# Patient Record
Sex: Female | Born: 1980 | Race: Black or African American | Hispanic: No | Marital: Married | Smoking: Never smoker
Health system: Southern US, Community
[De-identification: ages and names within clinical notes are randomized; demographics above are authoritative.]

---
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
## 2001-02-05 ENCOUNTER — Other Ambulatory Visit: Admission: RE | Admit: 2001-02-05 | Discharge: 2001-02-05 | Payer: Self-pay | Admitting: Gynecology

## 2001-04-23 ENCOUNTER — Emergency Department (HOSPITAL_COMMUNITY): Admission: EM | Admit: 2001-04-23 | Discharge: 2001-04-24 | Payer: Self-pay | Admitting: Internal Medicine

## 2001-04-23 ENCOUNTER — Encounter: Payer: Self-pay | Admitting: Internal Medicine

## 2004-10-03 ENCOUNTER — Other Ambulatory Visit: Admission: RE | Admit: 2004-10-03 | Discharge: 2004-10-03 | Payer: Self-pay | Admitting: Obstetrics & Gynecology

## 2004-10-03 ENCOUNTER — Inpatient Hospital Stay (HOSPITAL_COMMUNITY): Admission: AD | Admit: 2004-10-03 | Discharge: 2004-10-13 | Payer: Self-pay | Admitting: Obstetrics & Gynecology

## 2004-10-07 ENCOUNTER — Encounter: Payer: Self-pay | Admitting: Obstetrics & Gynecology

## 2005-01-26 ENCOUNTER — Other Ambulatory Visit: Admission: RE | Admit: 2005-01-26 | Discharge: 2005-01-26 | Payer: Self-pay | Admitting: Obstetrics & Gynecology

## 2006-08-31 ENCOUNTER — Inpatient Hospital Stay (HOSPITAL_COMMUNITY): Admission: AD | Admit: 2006-08-31 | Discharge: 2006-09-02 | Payer: Self-pay | Admitting: Obstetrics & Gynecology

## 2006-09-03 ENCOUNTER — Encounter: Admission: RE | Admit: 2006-09-03 | Discharge: 2006-10-03 | Payer: Self-pay | Admitting: Obstetrics & Gynecology

## 2006-12-11 ENCOUNTER — Emergency Department (HOSPITAL_COMMUNITY): Admission: EM | Admit: 2006-12-11 | Discharge: 2006-12-11 | Payer: Self-pay | Admitting: Emergency Medicine

## 2009-04-13 ENCOUNTER — Ambulatory Visit (HOSPITAL_COMMUNITY): Admission: RE | Admit: 2009-04-13 | Discharge: 2009-04-13 | Payer: Self-pay | Admitting: Obstetrics & Gynecology

## 2009-05-17 ENCOUNTER — Ambulatory Visit (HOSPITAL_COMMUNITY): Admission: RE | Admit: 2009-05-17 | Discharge: 2009-05-17 | Payer: Self-pay | Admitting: Obstetrics & Gynecology

## 2009-10-24 ENCOUNTER — Inpatient Hospital Stay (HOSPITAL_COMMUNITY): Admission: RE | Admit: 2009-10-24 | Discharge: 2009-10-26 | Payer: Self-pay | Admitting: Orthopedic Surgery

## 2009-10-24 ENCOUNTER — Encounter (INDEPENDENT_AMBULATORY_CARE_PROVIDER_SITE_OTHER): Payer: Self-pay | Admitting: Obstetrics & Gynecology

## 2009-10-27 ENCOUNTER — Encounter: Admission: RE | Admit: 2009-10-27 | Discharge: 2009-11-26 | Payer: Self-pay | Admitting: Obstetrics & Gynecology

## 2009-11-27 ENCOUNTER — Encounter: Admission: RE | Admit: 2009-11-27 | Discharge: 2009-12-12 | Payer: Self-pay | Admitting: Obstetrics & Gynecology

## 2009-12-20 ENCOUNTER — Emergency Department (HOSPITAL_COMMUNITY): Admission: EM | Admit: 2009-12-20 | Discharge: 2009-12-20 | Payer: Self-pay | Admitting: Emergency Medicine

## 2010-04-26 ENCOUNTER — Emergency Department (HOSPITAL_COMMUNITY): Admission: EM | Admit: 2010-04-26 | Discharge: 2010-04-26 | Payer: Self-pay | Admitting: Family Medicine

## 2010-07-19 ENCOUNTER — Encounter: Admission: RE | Admit: 2010-07-19 | Discharge: 2010-07-19 | Payer: Self-pay | Admitting: Obstetrics & Gynecology

## 2010-12-16 LAB — POCT URINALYSIS DIP (DEVICE)
Glucose, UA: NEGATIVE mg/dL
Hgb urine dipstick: NEGATIVE
Nitrite: NEGATIVE
Urobilinogen, UA: 0.2 mg/dL (ref 0.0–1.0)
pH: 6 (ref 5.0–8.0)

## 2010-12-16 LAB — WET PREP, GENITAL: Clue Cells Wet Prep HPF POC: NONE SEEN

## 2010-12-17 LAB — CBC
HCT: 30.3 % — ABNORMAL LOW (ref 36.0–46.0)
Hemoglobin: 10.9 g/dL — ABNORMAL LOW (ref 12.0–15.0)
MCV: 80.9 fL (ref 78.0–100.0)
RBC: 3.74 MIL/uL — ABNORMAL LOW (ref 3.87–5.11)
RBC: 4.37 MIL/uL (ref 3.87–5.11)
WBC: 11.4 10*3/uL — ABNORMAL HIGH (ref 4.0–10.5)
WBC: 7.4 10*3/uL (ref 4.0–10.5)

## 2011-02-16 NOTE — H&P (Signed)
NAMEFREDDI, FORSTER NO.:  [id number]   MEDICAL RECORD NO.:  [id number]          PATIENT TYPE:  INP   LOCATION:  9303                          FACILITY:  [facility]   PHYSICIAN:  Ilda Mori, M.D.   DATE OF BIRTH:  02-Oct-1980   DATE OF ADMISSION:  10/03/2004  DATE OF DISCHARGE:                                HISTORY & PHYSICAL   REASON FOR ADMISSION:  This is a 30 year old nulligravid female, last  menstrual period September 17, 2004 who was admitted with possible tubo-  ovarian abscess.   HISTORY OF PRESENT ILLNESS:  The patient was treated by Urgent Care  approximately two weeks ago with what appears to be urinary tract infection  with Cipro.  She developed more pain and fever and urinary pressure and she  was reevaluated and felt to have possible PID and Levaquin was started.  She  developed fevers to 101 and presented to my office for gynecological  evaluation.   On evaluation in the office, her abdomen was benign but her pelvis was very  tender with fullness and an ultrasound in my office revealed what appeared  to be a 6 cm possible pelvic abscess.   ALLERGIES:  SULFA.   MEDICATIONS:  She was on no medications at the present time other than the  Levaquin.   PAST SURGICAL HISTORY:  No previous surgery.   PAST MEDICAL HISTORY:  No previous history of pelvic inflammatory disease.   FAMILY HISTORY:  Noncontributory.   REVIEW OF SYMPTOMS:  Negative.   PHYSICAL EXAMINATION:  VITAL SIGNS:  Temperature was 101, pulse 90, blood  pressure 118/60.  HEENT:  Normal.  NECK:  Supple.  CHEST:  Clear.  ABDOMEN:  Soft.  Only mildly tender in the lower quadrants.  No rebound and  no guarding.  PELVIC:  Vagina and vulva were normal.  Cervix was clean.  There was some  tenderness on motion.  Her uterus was tender and midline and there was a  right-sided fullness that was very tender.   ADMISSION DIAGNOSIS:  Possible pelvic abscess or tubo-ovarian abscess.   PLAN:   The plan is to admit her for IV antibiotics.   LABORATORY DATA:  Her admission laboratories revealed a white count on  admission of 19.6 thousand.  Electrolytes were essentially normal.  Her  potassium was slightly low at 3.____.      RK/MEDQ  D:  10/06/2004  T:  10/06/2004  Job:  [id number]

## 2011-02-16 NOTE — Op Note (Signed)
Sheri Bailey, Sheri Bailey                ACCOUNT NO.:  [id number]   MEDICAL RECORD NO.:  [id number]          PATIENT TYPE:  INP   LOCATION:  9162                          [facility]ACILITY:  [facility]   PHYSICIAN:  Gerrit Friends. Aldona Bar, M.D.   DATE OF BIRTH:  12-29-1980   DATE OF PROCEDURE:  08/31/2006  DATE OF DISCHARGE:                               OPERATIVE REPORT   PREOPERATIVE DIAGNOSIS:  Term pregnancy, ruptured membranes for 17  hours, arrest disorder of dilatation.   POSTOPERATIVE DIAGNOSES:  Term pregnancy, ruptured membranes for 17  hours, arrest disorder of dilatation, delivery of 7 pounds 8 ounces  female infant, Apgars 09/09.   PROCEDURE:  Primary low transverse cesarean section.   SURGEON:  Gerrit Friends. Aldona Bar, M.D.   ANESTHESIA:  Epidural.   HISTORY:  This 30 year old primigravida was admitted at term in early  morning of 08/31/2006 with ruptured membranes spontaneously at 12:30  a.m. on 12/01 - fluid was clear.  Her prenatal course was uneventful.  Epidural was eventually placed when it was noted that the patient was  progressing.  Examination at 9:00 a.m. found the patient to be 2 cm  dilated, 70% effaced with vertex at -2 station.  An IUPC was placed to  assist facilitate Pitocin augmentation.  The patient by 11:00 a.m. was 4  cm dilated, but unfortunately, but even with adequate labor as  documented by IUPC over the next 5-1/2 hours did not change her cervix.  Decision was made to proceed with low transverse cesarean section  because of arrest disorder of dilatation.   OPERATIVE PROCEDURE:  The patient's epidural was augmented and she was  taken to the operating room with a Foley catheter in place.  Once she  was adequately prepped and draped, good anesthetic levels were  documented and cesarean section was begun in the usual fashion.  A  Pfannenstiel incision was made with minimal difficulty, dissected down  sharply to and through the fascia in low transverse fashion with  hemostasis  created at each layer.  Subfascial space was created  inferiorly and superiorly, muscles separated in the midline.  Peritoneum  identified and entered appropriately with care taken to avoid the bowel  superiorly and the bladder inferiorly.  At this time the vesicouterine  peritoneum was identified, incised in a low transverse fashion, pushed  off the lower uterine segment with ease and sharp incision into the  uterus with Metzenbaum scissors was carried out a low transverse fashion  and extended laterally with fingers.  Thereafter with minimal  difficulty, delivery of viable 7 pounds 8 ounces female infant which  cried spontaneously at once was delivered.  After cord was clamped and  cut, the infant was passed off to the waiting team and ultimately taken  to nursery in good condition.  Apgars were 9 and 9 and subsequent weight  was 7 pounds 8 ounces.   After cord bloods were collected, the placenta was delivered intact.  Uterus was then delivered to facilitate closure.  Uterus was manually  inspected and rendered free of any remaining products of conception.  Good uterine  contractility was afforded with slowly given intravenous  Pitocin and manual stimulation.  Tubes and ovaries appeared normal.  There were some filmy adhesions coming off the back of the uterus  nonetheless, probably related to the patient's previous pelvic abscess  which was treated with drainage and antibiotics in January of 2006.   The ovaries otherwise appeared very normal as did both tubes and  ovaries.   The uterine incision was then closed using a single layer of #1 Vicryl  running locking fashion.  This was oversewn with several figure-of-eight  #1 Vicryls for additional hemostasis.  The abdomen at this time was  lavaged and rendered free of any blood and clot.  Uterus was replaced  into the abdominal incision.  Once all counts noted to be correct and no  foreign bodies noted to be remaining in the abdominal  cavity, closure of  the abdomen was begun in layers.  The abdominal peritoneum was closed  with 0 Vicryl in a running fashion and muscle secured with same.  Subfascial area was rendered hemostatic and the fascia was then  reapproximated with 0 Vicryl from angle to midline bilaterally.  Subcutaneous tissues were hemostatic and staples were used to close  skin.  Sterile pressure dressing was applied and the patient at this  time was transported to recovery room in satisfactory having tolerated  procedure well.  Estimated blood loss 500 mL.  All counts correct x2.  At conclusion of procedure both mother and baby were doing well in  respective recovery areas.      Gerrit Friends. Aldona Bar, M.D.  Electronically Signed     RMW/MEDQ  D:  08/31/2006  T:  09/02/2006  Job:  [id number]

## 2011-02-16 NOTE — Discharge Summary (Signed)
Sheri Bailey, Sheri Bailey                ACCOUNT NO.:  [id number]   MEDICAL RECORD NO.:  [id number]          PATIENT TYPE:  INP   LOCATION:  9148                          [facility]   PHYSICIAN:  Ilda Mori, M.D.   DATE OF BIRTH:  [birth year]   DATE OF ADMISSION:  08/31/2006  DATE OF DISCHARGE:  09/02/2006                               DISCHARGE SUMMARY   FINAL DIAGNOSES:  1. Intrauterine pregnancy at term.  2. Prolonged rupture of membranes.  3. Arrest disorder of dilation.   PROCEDURE:  Primary low transverse cesarean section.   SURGEON:  Dr. Annamaria Helling.   COMPLICATIONS:  None.   HISTORY:  This 30 year old G1, P0 was admitted at term with  spontaneously rupture of membranes at 12:30 a.m. on August 31, 2006.  Fluid was clear. The patient's antepartum course up to this point had  been uncomplicated. She did have some anemia for which she was on iron  and her prenatal vitamins throughout her pregnancy. She had a negative  group B strep culture obtained in the office at around 36 weeks.   HOSPITAL COURSE:  The patient had an epidural placed after she was about  2 cm dilated. Then she had a catheter placed as well. By 11 a.m. patient  was 4 cm dilated, but over the next 5-1/2 hours did not change her  cervix. At this point the decision was made to proceed with a cesarean  section secondary to the arrest disorder of dilation and prolonged  rupture of membranes. The patient was taken to the operating room by Dr.  Annamaria Helling on August 31, 2006 where a primary low transverse cesarean  section was performed with delivery of a 7 pound, 8 ounce female infant  with Apgar's of 9 and 9. The delivery went without complication.  The  patient's operative course was benign without any significant fevers.  She did have a postoperative hemoglobin of 8.8 for which she needed to  be on iron.  She was felt ready for discharge on postoperative day  number two.   DISPOSITION:  She was sent  home on a regular diet. Activity to be  decreased. Told to continue her prenatal vitamins and iron supplement  daily. She could use Colace p.r.n. She was given Tylox 1-2 q.4h p.r.n.  for pain and told she could use Motrin up to 600 mg q.6 hours p.r.n. for  pain. She is to follow up in our office in 4 weeks.   LABS ON DISCHARGE:  Patient had a hemoglobin of 8.8 which is down from  11.1 preoperatively, white blood cell count is 17,500, platelets of  189,000.      [name], P.A.-C.      Ilda Mori, M.D.  Electronically Signed    MB/MEDQ  D:  09/28/2006  T:  09/28/2006  Job:  [id number]

## 2011-02-16 NOTE — Discharge Summary (Signed)
Sheri Bailey, Sheri Bailey                ACCOUNT NO.:  [id number]   MEDICAL RECORD NO.:  [id number]          PATIENT TYPE:  INP   LOCATION:  9303                          [facility]   PHYSICIAN:  Ilda Mori, M.D.   DATE OF BIRTH:  1980-10-08   DATE OF ADMISSION:  10/03/2004  DATE OF DISCHARGE:  10/13/2004                                 DISCHARGE SUMMARY   FINAL DIAGNOSIS:  Tubo-ovarian abscess.   SECONDARY DIAGNOSES:  None.   PROCEDURES:  CT directed percutaneous drainage of pelvic abscess.   COMPLICATIONS:  None.   CONDITION ON DISCHARGE:  Improved.   HISTORY OF PRESENT ILLNESS:  This is a 30 year old nulligravida female who  presented to the office with fever and pelvic pain.  Evaluation in the  office revealed a tender pelvic mass.  Office ultrasound confirmed the  presence of a 6 cm pelvic abscess.  The patient was admitted to the hospital  where IV antibiotics were begun.   HOSPITAL COURSE:  She was treated with clindamycin and gentamicin for 48  hours with minimal improvement.  She remained febrile and developed a  significant persistent leukocytosis.  Ampicillin was begun after 48 hours  and the addition of this antibiotic did not help improve the patient's  condition.  On hospital day #3, she had been on 60 hours of clindamycin and  gentamicin and 12 hours of ampicillin.  She still maintained a white count  of 20,000 and still had spiking temperatures up to 102 degrees.  The  decision was made to proceed with laparotomy, however, after further  consultation with radiology and general surgery, the decision was made to  attempt to do a percutaneous drainage of the abscess.  On the morning of the  fourth hospital day, she was transferred to [facility] where a CT  directed percutaneous catheter was placed into the abscess.  There were 20-  30 mL of pus drained by the radiologist, Sherrine Maples T. Fredia Sorrow, M.D.  The  catheter was left in situ and the patient was  transferred back to [facility] and her antibiotics were continued.  The patient slowly defervesced  over the next three hospital days.  On hospital day #8, her temperature was  101 degrees, which was significantly better than her previous temperature  and her white blood cell count was down to 9800.  The antibiotics were  continued for another 48 hours until the patient had been afebrile for 24  hours.  The catheter was pulled on hospital day #9 when only small amounts  of drainage were noted to be present.  Antibiotics were continued for  another 24 hours and the patient was then discharged.  She was discharged on  a regular diet.  She was told to limit her activity.  She was not given any  further antibiotics to take at that time.  She was asked to return to the  office in four days for followup evaluation.   LABORATORY DATA:  Admission hemoglobin 10.1 and admission white blood cell  count 19,600.  Her hemoglobin dropped to the 7.5  range during the  hospitalization.  Her white count peaked at 22,000 on hospital day #4 and  then began to decrease.  On the day of discharge, her white count was 8900.  CT scan revealed multilocular pelvic collections consistent with  multilocular abscesses, possibly a complications of tubo-ovarian pathology.  The  original CT scan did not appear to show a single dominant component that  would be amenable to drainage.  However, a followup evaluation revealed that  drainage would be possible and hopefully beneficial.  Pelvic ultrasound  revealed a 7 cm complex cystic lesion anterior to the uterus.      RK/MEDQ  D:  10/31/2004  T:  10/31/2004  Job:  [id number]   cc:   Sherrine Maples T. Fredia Sorrow, M.D.  [address]  [place]  [place] [address]  Fax: [phone]

## 2013-05-15 ENCOUNTER — Other Ambulatory Visit: Payer: Self-pay

## 2015-11-22 ENCOUNTER — Emergency Department (HOSPITAL_COMMUNITY)
Admission: EM | Admit: 2015-11-22 | Discharge: 2015-11-23 | Disposition: A | Discharge status: Home / Self Care | Payer: BLUE CROSS/BLUE SHIELD | Attending: Emergency Medicine | Admitting: Emergency Medicine

## 2015-11-22 ENCOUNTER — Encounter (HOSPITAL_COMMUNITY): Payer: Self-pay | Admitting: Emergency Medicine

## 2015-11-22 ENCOUNTER — Emergency Department (HOSPITAL_COMMUNITY): Payer: BLUE CROSS/BLUE SHIELD

## 2015-11-22 DIAGNOSIS — Y9389 Activity, other specified: Secondary | ICD-10-CM | POA: Insufficient documentation

## 2015-11-22 DIAGNOSIS — X58XXXA Exposure to other specified factors, initial encounter: Secondary | ICD-10-CM | POA: Insufficient documentation

## 2015-11-22 DIAGNOSIS — Y998 Other external cause status: Secondary | ICD-10-CM | POA: Diagnosis not present

## 2015-11-22 DIAGNOSIS — Y9289 Other specified places as the place of occurrence of the external cause: Secondary | ICD-10-CM | POA: Insufficient documentation

## 2015-11-22 DIAGNOSIS — S8991XA Unspecified injury of right lower leg, initial encounter: Secondary | ICD-10-CM | POA: Diagnosis present

## 2015-11-22 DIAGNOSIS — M2391 Unspecified internal derangement of right knee: Secondary | ICD-10-CM | POA: Diagnosis not present

## 2015-11-22 NOTE — ED Notes (Signed)
Pt c/o R knee pain onset today while exercising.

## 2015-11-23 MED ORDER — IBUPROFEN 100 MG/5ML PO SUSP
600.0000 mg | Freq: Once | ORAL | Status: AC
  Administered 2015-11-23: 600 mg via ORAL
  Filled 2015-11-23: qty 30

## 2015-11-23 NOTE — ED Provider Notes (Signed)
CSN: [id number]     Arrival date & time 11/22/15  2003 History  By signing my name below, I, Sheri Bailey, attest that this documentation has been prepared under the direction and in the presence of Devoria Albe, MD at [phone]. Electronically Signed: Soijett Bailey, ED Scribe. 11/23/2015. 3:54 AM.   Chief Complaint  Patient presents with  . Knee Injury      The history is provided by the patient. No language interpreter was used.    Sheri Bailey is a 35 y.o. female who presents to the Emergency Department complaining of right knee injury occurring 1 PM yesterday. Pt states that she heard a pop while doing jumping jacks with no pain initially. She states about 4 PM she started getting some discomfort in the medial part of her knee and by 6 PM he had gotten so bad she was unable to walk on her knee. Pt is having associated symptoms of right knee swelling. She notes that she has not tried any medications for the relief of her symptoms. She denies color change, wound, rash, and any other symptoms. Pt denies having an orthopedist at this time.   PCP none   History reviewed. No pertinent past medical history. History reviewed. No pertinent past surgical history. No family history on file. Social History  Substance Use Topics  . Smoking status: Never Smoker   . Smokeless tobacco: None  . Alcohol Use: No   OB History    No data available     Review of Systems  Musculoskeletal: Positive for joint swelling and arthralgias.  Skin: Negative for color change, rash and wound.  All other systems reviewed and are negative.     Allergies  Sulfa antibiotics  Home Medications   Prior to Admission medications   Not on File   BP 126/65 mmHg  Pulse 79  Temp(Src) 98.3 F (36.8 C) (Oral)  Resp 18  Ht  (1.549 m)  Wt 190 lb (86.183 kg)  BMI 35.92 kg/m2  SpO2 100%  LMP 11/16/2015  Vital signs normal   Physical Exam  Constitutional: She is oriented to person, place, and time. She  appears well-developed and well-nourished. No distress.  HENT:  Head: Normocephalic and atraumatic.  Eyes: EOM are normal.  Neck: Normal range of motion. Neck supple.  Pulmonary/Chest: Effort normal. No respiratory distress.  Abdominal: There is no tenderness.  Musculoskeletal: Normal range of motion. She exhibits tenderness.  Patella non-tendner bilaterally. Tenderness over the medial joint space on right knee, with pain on stressing the medial collateral ligament. No pain laterally. No pain with stressing the lateral collateral ligament.   Neurological: She is alert and oriented to person, place, and time.  Skin: Skin is warm and dry.  Psychiatric: She has a normal mood and affect. Her behavior is normal.  Nursing note and vitals reviewed.   ED Course  Procedures (including critical care time)  Medications  ibuprofen (ADVIL,MOTRIN) 100 MG/5ML suspension 600 mg (600 mg Oral Given 11/23/15 0440)    DIAGNOSTIC STUDIES: Oxygen Saturation is 100% on RA, nl by my interpretation.    COORDINATION OF CARE: 3:55 AM Discussed treatment plan with pt family at bedside which includes knee immobilizer, ice, anti-inflammatory, and referral to orthopedics and pt family  agreed to plan.  Patient states she's unable to swallow pills. She was given liquid ibuprofen and we discussed she can take children's Motrin and Tylenol over-the-counter for her pain. She was placed in a knee immobilizer. She already has  crutches. We discussed this may be a ligament strain or it could be a meniscal problem, however the treatment at this point of the same which is ice, elevation, wear the knee immobilizer and crutches if still unable to walk with a knee immobilizer in place. She can take ibuprofen for pain. She was given a referral to an orthopedist if she still not improving in the next week.  Dg Knee Complete 4 Views Right  11/22/2015  CLINICAL DATA:  Initial evaluation for acute knee pain. EXAM: RIGHT KNEE -  COMPLETE 4+ VIEW COMPARISON:  None. FINDINGS: There is no evidence of fracture, dislocation, or joint effusion. There is no evidence of arthropathy or other focal bone abnormality. Soft tissues are unremarkable. IMPRESSION: No acute osseous abnormality about the knee. Electronically Signed   By: Rise Mu M.D.   On: 11/22/2015 20:41    I have personally reviewed and evaluated these lab results as part of my medical decision-making.    MDM   Final diagnoses:  Internal derangement of knee, right   Plan discharge  Devoria Albe, MD, FACEP   I personally performed the services described in this documentation, which was scribed in my presence. The recorded information has been reviewed and considered.  Devoria Albe, MD, Concha Pyo, MD 11/23/15 [phone]

## 2015-11-23 NOTE — ED Notes (Signed)
Pt given ice pack, ambulatory to WR with steady gait on crutches

## 2015-11-23 NOTE — Discharge Instructions (Signed)
Use ice on the painful area. Wear the knee immobilizer to stabilize your knee. You can also use your crutches if it is too painful to walk with just the immobilizer. Take childrens liquid motrin 600 mg and/or acetaminophen 1000 mg (both should be 2 tablespoons) every 6 hrs as needed for pain. Recheck with Dr Roda Shutters, the orthopedist on call, if you continue to have pain in your knee after the first week.

## 2021-07-20 ENCOUNTER — Other Ambulatory Visit: Payer: Self-pay | Admitting: Obstetrics

## 2021-07-20 DIAGNOSIS — R928 Other abnormal and inconclusive findings on diagnostic imaging of breast: Secondary | ICD-10-CM

## 2021-08-04 ENCOUNTER — Ambulatory Visit
Admission: RE | Admit: 2021-08-04 | Discharge: 2021-08-04 | Disposition: A | Discharge status: Home / Self Care | Payer: BC Managed Care – PPO | Source: Ambulatory Visit | Attending: Obstetrics | Admitting: Obstetrics

## 2021-08-04 ENCOUNTER — Ambulatory Visit
Admission: RE | Admit: 2021-08-04 | Discharge: 2021-08-04 | Disposition: A | Discharge status: Home / Self Care | Payer: BLUE CROSS/BLUE SHIELD | Source: Ambulatory Visit | Attending: Obstetrics | Admitting: Obstetrics

## 2021-08-04 ENCOUNTER — Other Ambulatory Visit: Payer: Self-pay

## 2021-08-04 ENCOUNTER — Other Ambulatory Visit: Payer: Self-pay | Admitting: Obstetrics

## 2021-08-04 DIAGNOSIS — R928 Other abnormal and inconclusive findings on diagnostic imaging of breast: Secondary | ICD-10-CM

## 2021-10-25 DIAGNOSIS — M25522 Pain in left elbow: Secondary | ICD-10-CM | POA: Diagnosis not present

## 2021-10-25 DIAGNOSIS — M7022 Olecranon bursitis, left elbow: Secondary | ICD-10-CM | POA: Diagnosis not present

## 2022-02-02 ENCOUNTER — Ambulatory Visit
Admission: RE | Admit: 2022-02-02 | Discharge: 2022-02-02 | Disposition: A | Discharge status: Home / Self Care | Payer: BC Managed Care – PPO | Source: Ambulatory Visit | Attending: Obstetrics | Admitting: Obstetrics

## 2022-02-02 DIAGNOSIS — R928 Other abnormal and inconclusive findings on diagnostic imaging of breast: Secondary | ICD-10-CM

## 2022-02-02 DIAGNOSIS — N6489 Other specified disorders of breast: Secondary | ICD-10-CM | POA: Diagnosis not present

## 2022-08-16 DIAGNOSIS — Z1231 Encounter for screening mammogram for malignant neoplasm of breast: Secondary | ICD-10-CM | POA: Diagnosis not present

## 2022-08-16 DIAGNOSIS — Z01419 Encounter for gynecological examination (general) (routine) without abnormal findings: Secondary | ICD-10-CM | POA: Diagnosis not present

## 2022-10-04 DIAGNOSIS — Z0000 Encounter for general adult medical examination without abnormal findings: Secondary | ICD-10-CM | POA: Diagnosis not present

## 2022-10-04 DIAGNOSIS — Z8639 Personal history of other endocrine, nutritional and metabolic disease: Secondary | ICD-10-CM | POA: Diagnosis not present

## 2022-10-04 DIAGNOSIS — Z13220 Encounter for screening for lipoid disorders: Secondary | ICD-10-CM | POA: Diagnosis not present

## 2023-01-07 DIAGNOSIS — M25562 Pain in left knee: Secondary | ICD-10-CM | POA: Diagnosis not present

## 2023-01-25 DIAGNOSIS — M1712 Unilateral primary osteoarthritis, left knee: Secondary | ICD-10-CM | POA: Diagnosis not present

## 2023-08-22 IMAGING — MG MM DIGITAL DIAGNOSTIC UNILAT*R* W/ TOMO W/ CAD
4 series · 4 of 12 positions shown · non-contrast
Comparison: Previous exam(s).

CLINICAL DATA: Six-month follow-up of a probably benign right
breast mass.

EXAM:
DIGITAL DIAGNOSTIC UNILATERAL RIGHT MAMMOGRAM WITH TOMOSYNTHESIS AND
CAD; ULTRASOUND RIGHT BREAST LIMITED
TECHNIQUE: Right digital diagnostic mammography and breast tomosynthesis was
performed. The images were evaluated with computer-aided detection.;
Targeted ultrasound examination of the right breast was performed

[R MLO synth-2D]
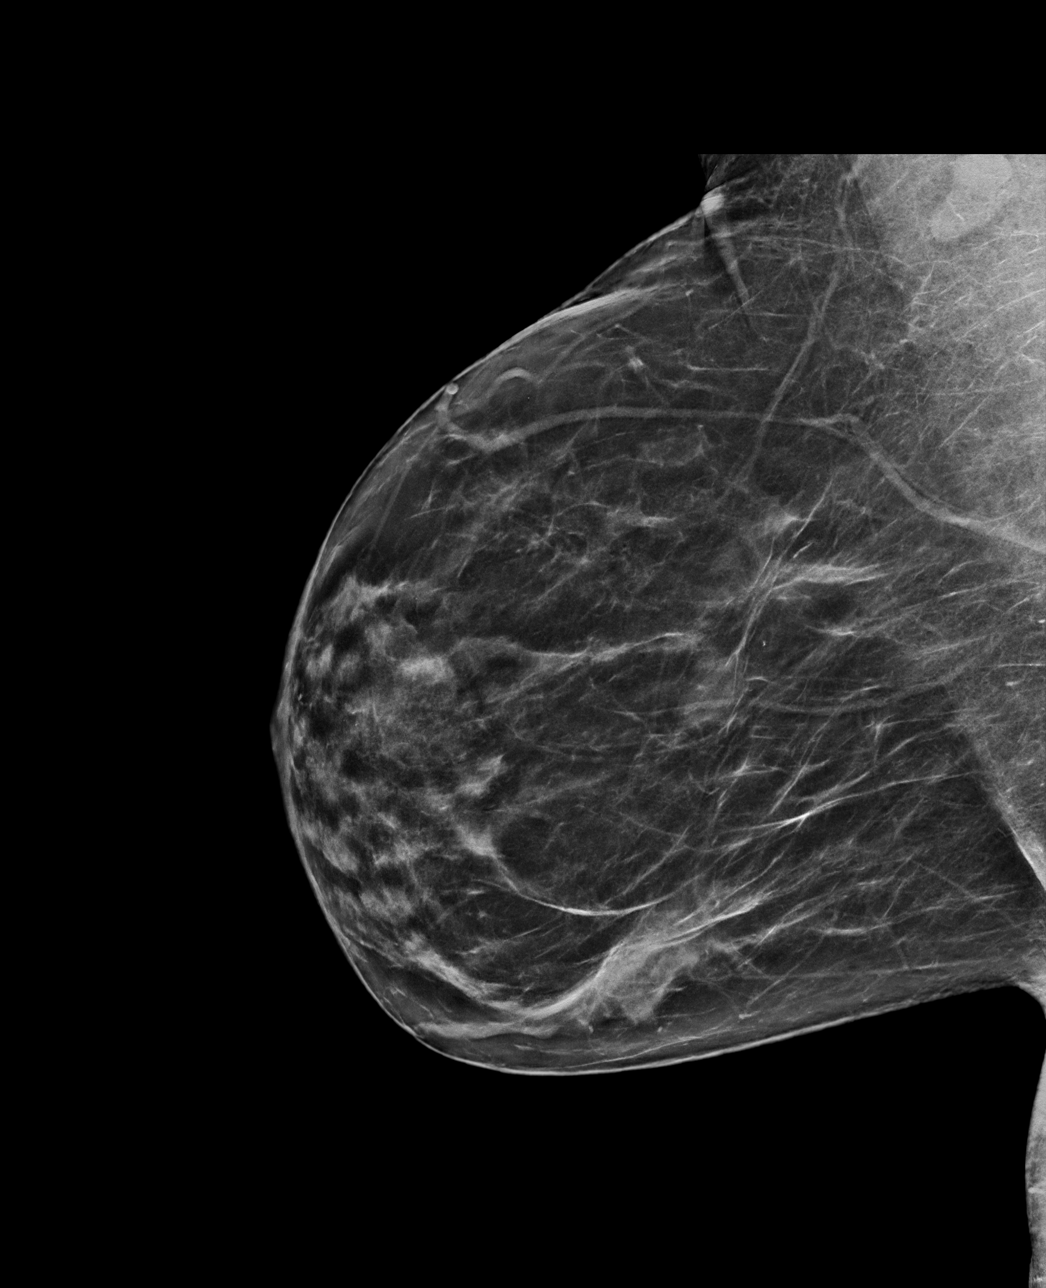

[R CC synth-2D]
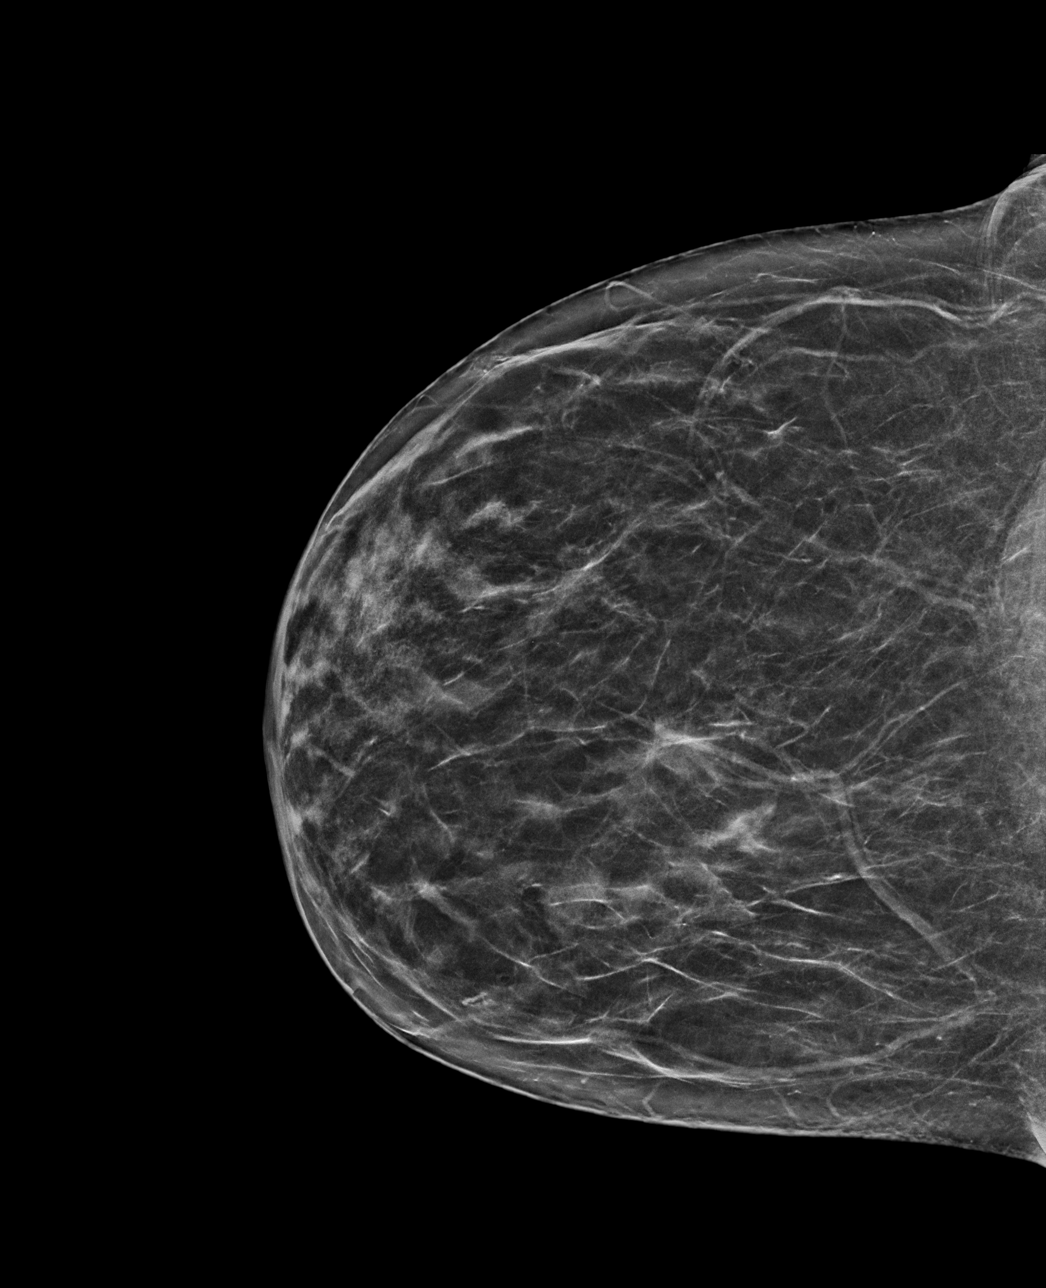

[R MLO tomo · tomo slice 37/72.0]
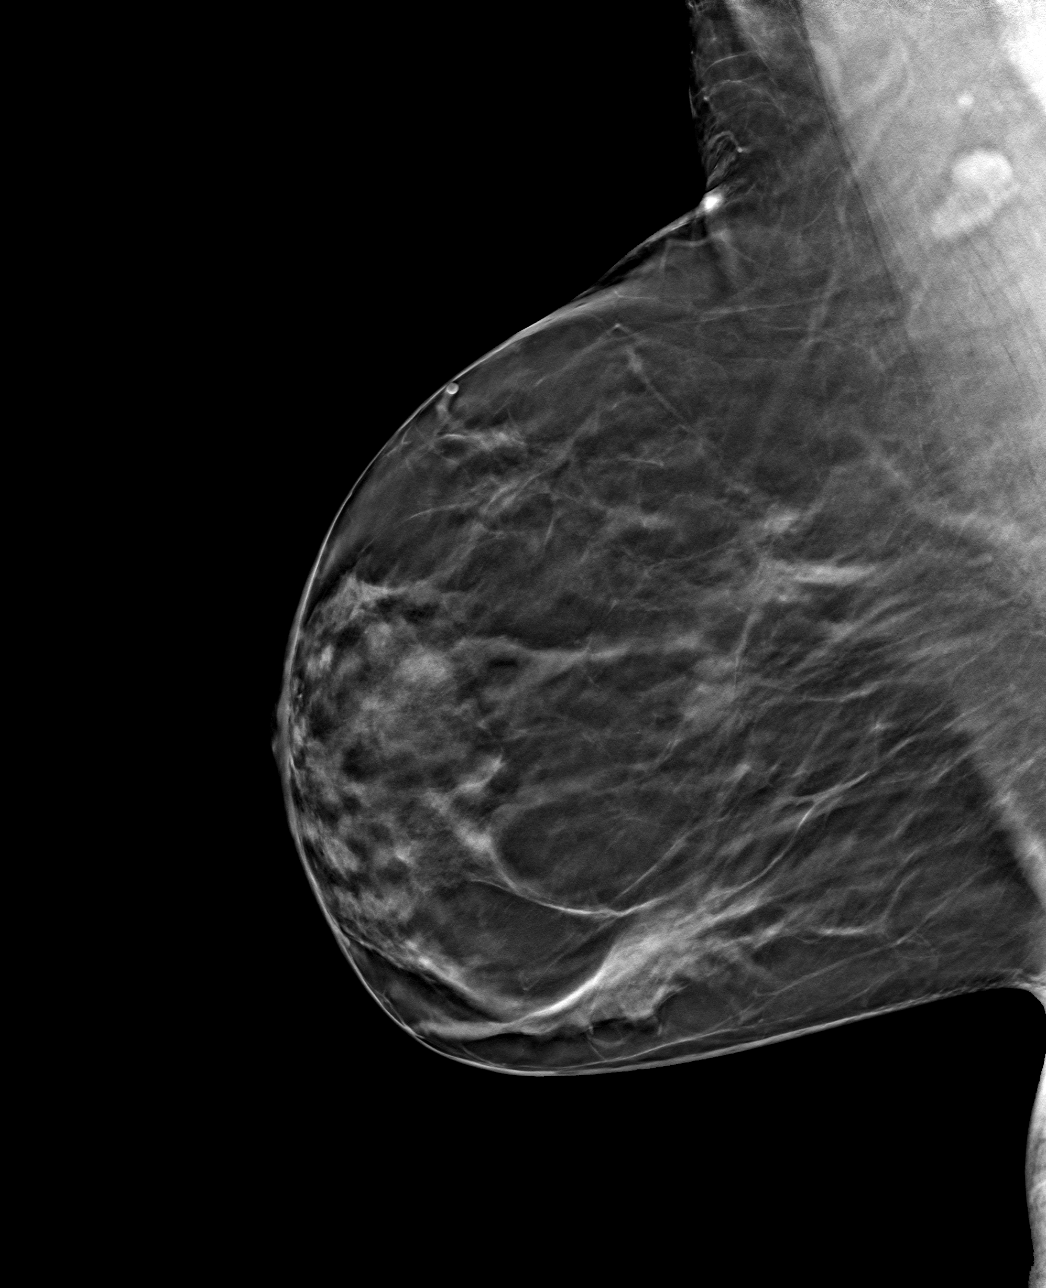

[R CC tomo · tomo slice 32/63.0]
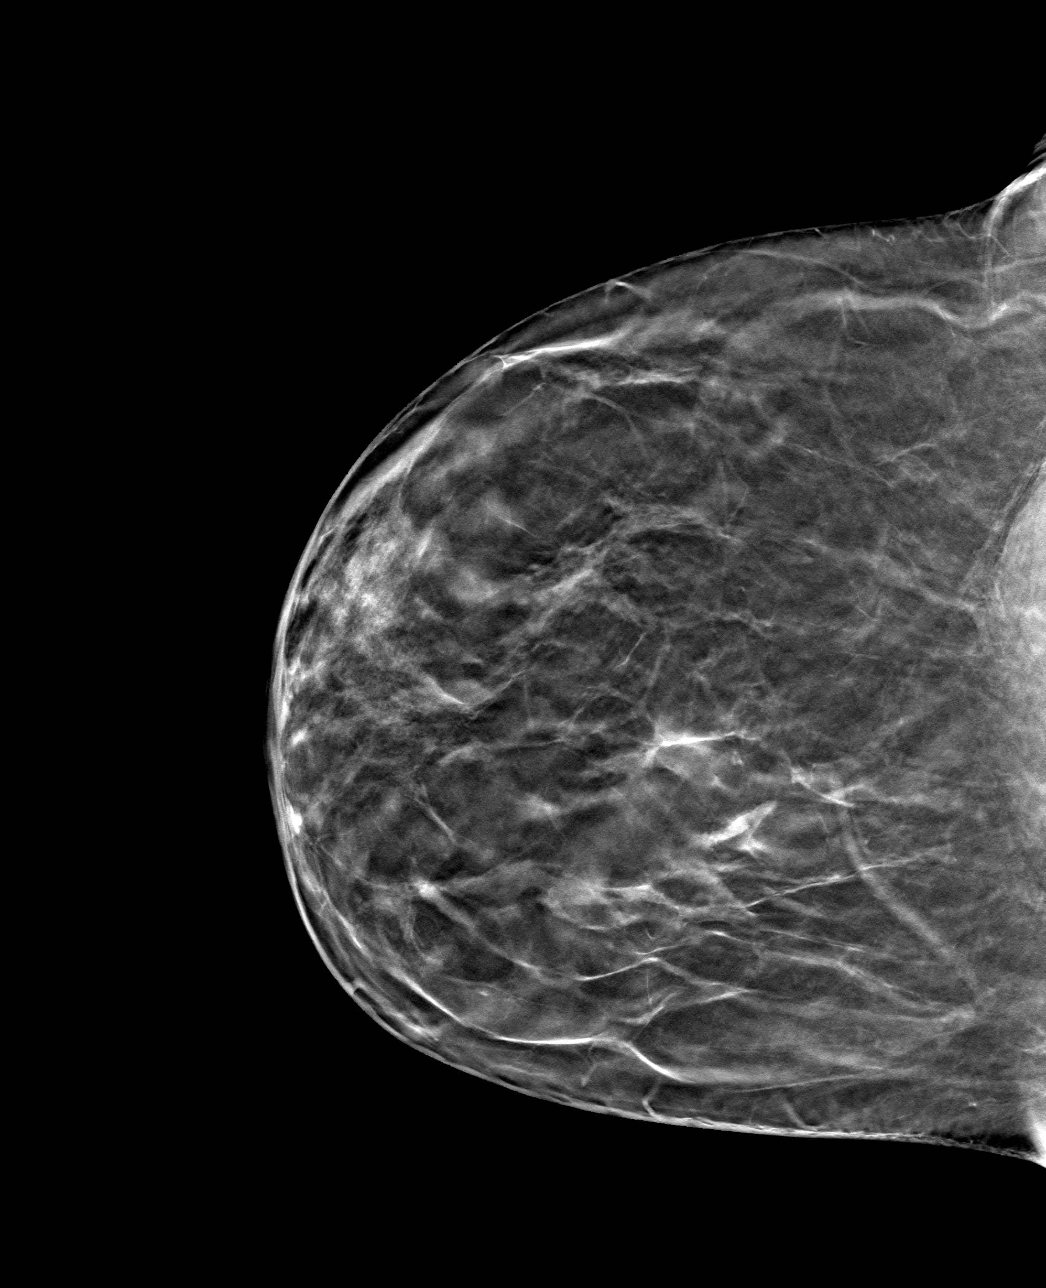

[4 of 12 positions shown; findings below may reference images not displayed]

ACR Breast Density Category b: There are scattered areas of
fibroglandular density.
FINDINGS: The probably benign right breast mass is a normal intramammary lymph
node with a fatty hilum.

On physical exam, no suspicious lumps are identified.

Targeted ultrasound is performed, showing an intramammary lymph node
at 8 o'clock correlating with the mammographically identified mass.
IMPRESSION: The mass in the right breast is a normal intramammary lymph node. No
further follow-up is necessary. No evidence of malignancy. Recommend
annual screening mammography.

RECOMMENDATION:
Recommend annual screening mammography. The patient's last screening
mammogram was July 14, 2021.

I have discussed the findings and recommendations with the patient.
If applicable, a reminder letter will be sent to the patient
regarding the next appointment.

BI-RADS CATEGORY  2: Benign.

## 2023-08-22 IMAGING — US US BREAST*R* LIMITED INC AXILLA
1 series · 5 of 5 positions shown · non-contrast
Comparison: Previous exam(s).

CLINICAL DATA: Six-month follow-up of a probably benign right
breast mass.

EXAM:
DIGITAL DIAGNOSTIC UNILATERAL RIGHT MAMMOGRAM WITH TOMOSYNTHESIS AND
CAD; ULTRASOUND RIGHT BREAST LIMITED
TECHNIQUE: Right digital diagnostic mammography and breast tomosynthesis was
performed. The images were evaluated with computer-aided detection.;
Targeted ultrasound examination of the right breast was performed

[Series 1: us breast*right* limited inc axilla · 0.05mm/px · 5 of 5 slices shown]
[im 1/5]
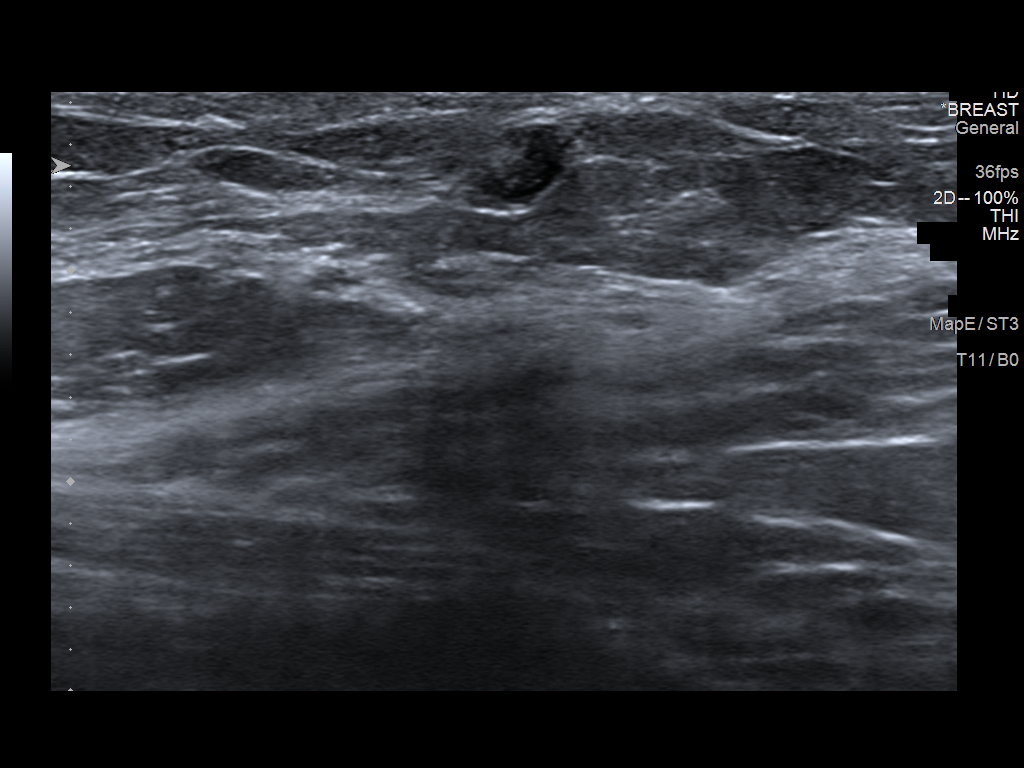
[im 2/5]
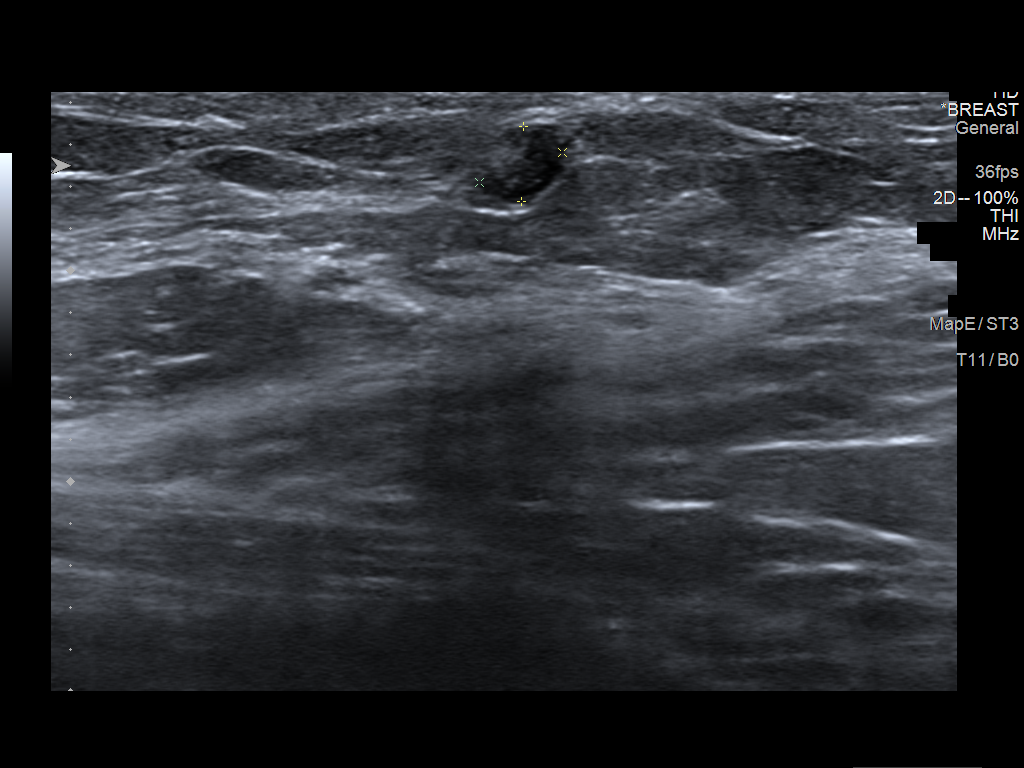
[im 3/5]
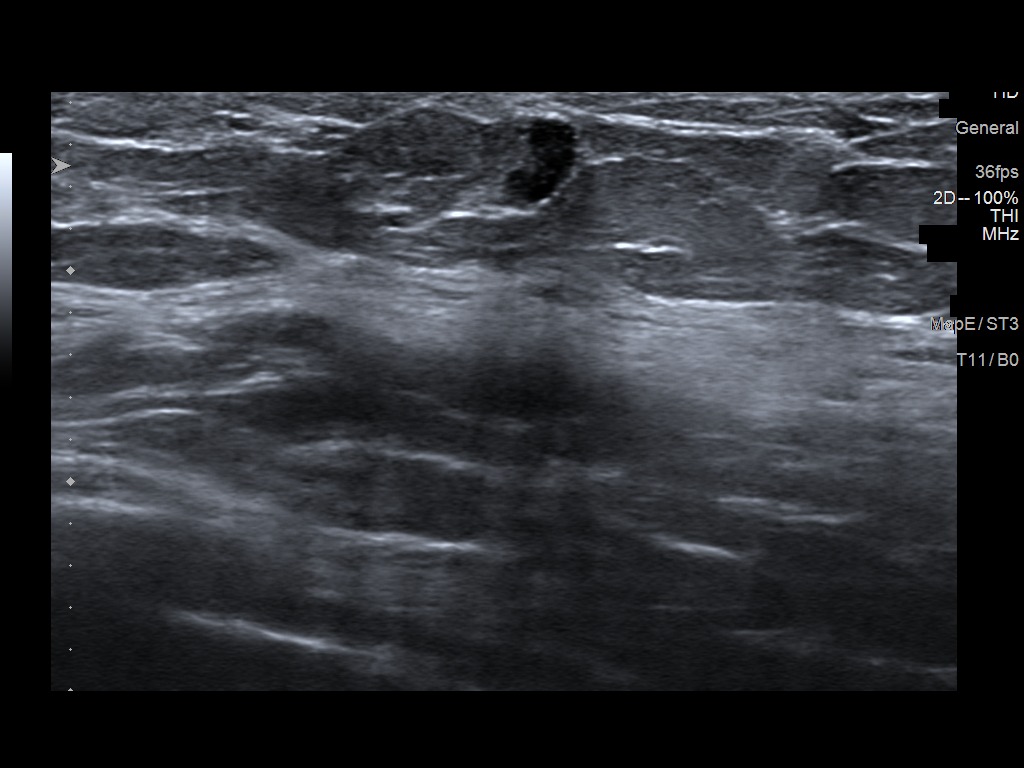
[im 4/5]
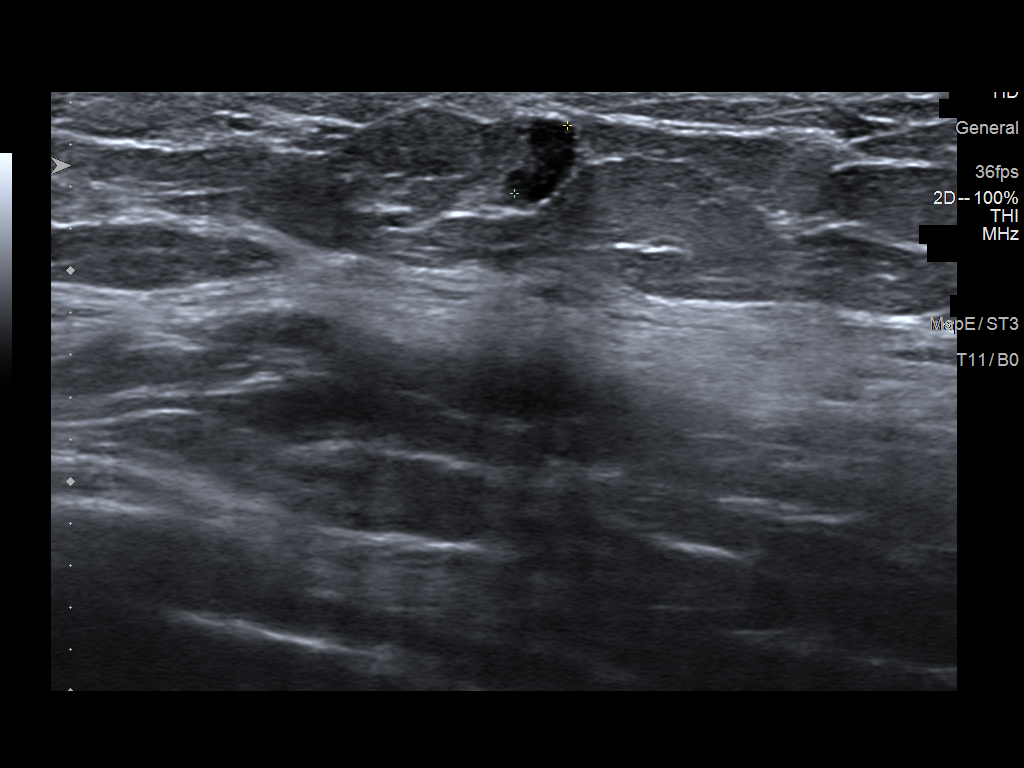
[im 5/5]
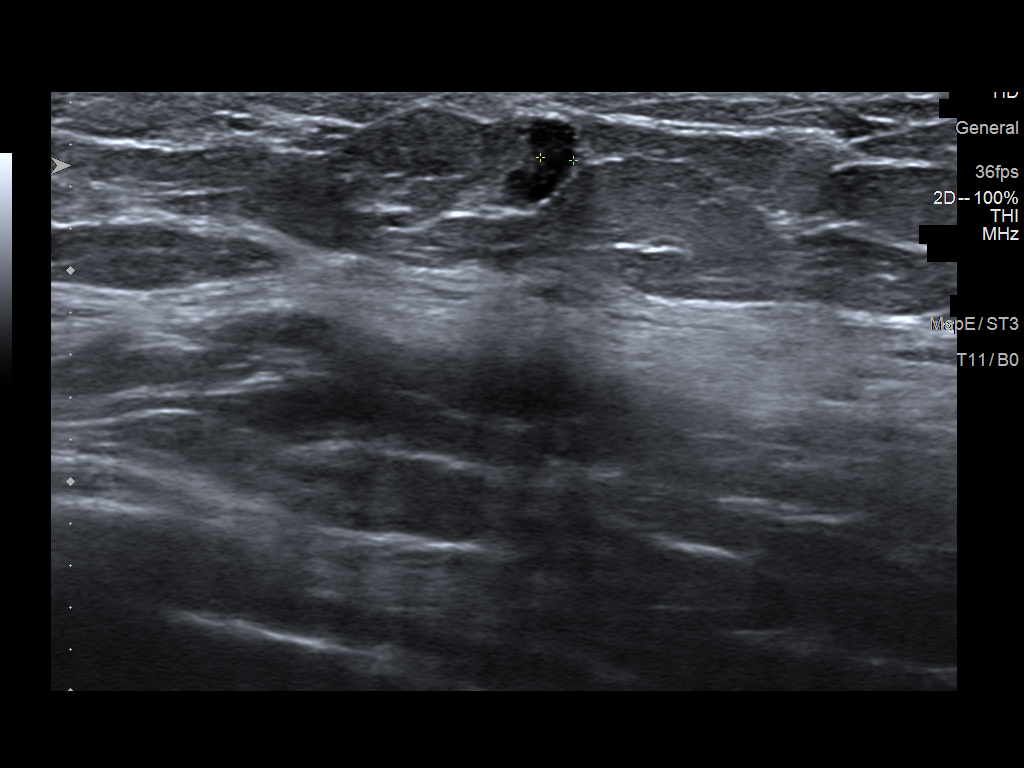

[5 of 5 positions shown; findings below may reference images not displayed]

ACR Breast Density Category b: There are scattered areas of
fibroglandular density.
FINDINGS: The probably benign right breast mass is a normal intramammary lymph
node with a fatty hilum.

On physical exam, no suspicious lumps are identified.

Targeted ultrasound is performed, showing an intramammary lymph node
at 8 o'clock correlating with the mammographically identified mass.
IMPRESSION: The mass in the right breast is a normal intramammary lymph node. No
further follow-up is necessary. No evidence of malignancy. Recommend
annual screening mammography.

RECOMMENDATION:
Recommend annual screening mammography. The patient's last screening
mammogram was July 14, 2021.

I have discussed the findings and recommendations with the patient.
If applicable, a reminder letter will be sent to the patient
regarding the next appointment.

BI-RADS CATEGORY  2: Benign.

## 2023-08-23 DIAGNOSIS — Z01419 Encounter for gynecological examination (general) (routine) without abnormal findings: Secondary | ICD-10-CM | POA: Diagnosis not present

## 2023-08-23 DIAGNOSIS — Z1231 Encounter for screening mammogram for malignant neoplasm of breast: Secondary | ICD-10-CM | POA: Diagnosis not present
